# Patient Record
Sex: Female | Born: 1955 | Race: Asian | Hispanic: No | Smoking: Never smoker
Health system: Southern US, Community
[De-identification: ages and names within clinical notes are randomized; demographics above are authoritative.]

## PROBLEM LIST (undated history)

## (undated) DIAGNOSIS — I10 Essential (primary) hypertension: Secondary | ICD-10-CM

## (undated) DIAGNOSIS — E7800 Pure hypercholesterolemia, unspecified: Secondary | ICD-10-CM

---

## 2016-11-18 ENCOUNTER — Emergency Department: Payer: Medicare (Managed Care)

## 2016-11-18 ENCOUNTER — Emergency Department
Admission: EM | Admit: 2016-11-18 | Discharge: 2016-11-18 | Disposition: A | Discharge status: Home / Self Care | Payer: Medicare (Managed Care) | Attending: Emergency Medicine | Admitting: Emergency Medicine

## 2016-11-18 ENCOUNTER — Encounter: Payer: Self-pay | Admitting: Emergency Medicine

## 2016-11-18 DIAGNOSIS — R52 Pain, unspecified: Secondary | ICD-10-CM

## 2016-11-18 DIAGNOSIS — L03116 Cellulitis of left lower limb: Secondary | ICD-10-CM | POA: Diagnosis not present

## 2016-11-18 DIAGNOSIS — I10 Essential (primary) hypertension: Secondary | ICD-10-CM | POA: Insufficient documentation

## 2016-11-18 DIAGNOSIS — M7989 Other specified soft tissue disorders: Secondary | ICD-10-CM | POA: Diagnosis present

## 2016-11-18 DIAGNOSIS — L819 Disorder of pigmentation, unspecified: Secondary | ICD-10-CM

## 2016-11-18 DIAGNOSIS — R609 Edema, unspecified: Secondary | ICD-10-CM

## 2016-11-18 HISTORY — DX: Essential (primary) hypertension: I10

## 2016-11-18 HISTORY — DX: Pure hypercholesterolemia, unspecified: E78.00

## 2016-11-18 MED ORDER — SULFAMETHOXAZOLE-TRIMETHOPRIM 800-160 MG PO TABS: 1.0000 | ORAL_TABLET | Freq: Two times a day (BID) | ORAL | 0 refills | Status: AC

## 2016-11-18 MED ORDER — CEPHALEXIN 500 MG PO CAPS: 500.0000 mg | ORAL_CAPSULE | Freq: Three times a day (TID) | ORAL | 0 refills | Status: AC

## 2016-11-18 NOTE — ED Triage Notes (Signed)
Presents with family   Developed pain and swelling to left lower leg  Leg is warm touch  Denies injury

## 2016-11-18 NOTE — ED Notes (Signed)
Patient transported to Ultrasound 

## 2016-11-18 NOTE — ED Notes (Signed)
Discharge instructions reviewed with patient. Questions fielded by this RN. Patient verbalizes understanding of instructions. Patient discharged home in stable condition per Stafford MD . No acute distress noted at time of discharge.  

## 2016-11-18 NOTE — ED Provider Notes (Signed)
[facility] Emergency Department Provider Note  ____________________________________________  Time seen: Approximately 7:46 PM  I have reviewed the triage vital signs and the nursing notes.   HISTORY  Chief Complaint Leg Pain and Leg Swelling    HPI Chelsea Huff is a 60 y.o. female who complains of left leg swelling and redness over the past 2 days. Warm to touch. Denies any injury. her scratches. Has a history of hypertension and hypercholesterolemia, denies diabetes. No previous surgeries on the leg. No history of DVT. She did recently fly down to [place] from [place] about a week ago. Denies chest pain or shortness of breath. No dizziness.     Past Medical History:  Diagnosis Date  . High cholesterol   . Hypertension      There are no active problems to display for this patient.    History reviewed. No pertinent surgical history.   Prior to Admission medications   Medication Sig Start Date End Date Taking? Authorizing Provider  cephALEXin (KEFLEX) 500 MG capsule Take 1 capsule (500 mg total) by mouth 3 (three) times daily. 11/18/16   Sharman CheekPhillip Georganne Siple, MD  sulfamethoxazole-trimethoprim (BACTRIM DS) 800-160 MG tablet Take 1 tablet by mouth 2 (two) times daily. 11/18/16   Sharman CheekPhillip Aldona Bryner, MD     Allergies Patient has no known allergies.   No family history on file.  Social History Social History  Substance Use Topics  . Smoking status: Never Smoker  . Smokeless tobacco: Never Used  . Alcohol use No    Review of Systems  Constitutional:   No fever or chills.  ENT:   No sore throat. No rhinorrhea.Occasional bitter taste in the throat. Cardiovascular:   No chest pain. Respiratory:   No dyspnea or cough. Gastrointestinal:   Negative for abdominal pain, vomiting and diarrhea.  Genitourinary:   Negative for dysuria or difficulty urinating. Musculoskeletal:   Left leg pain and swelling and redness Neurological:    Negative for headaches 10-point ROS otherwise negative.  ____________________________________________   PHYSICAL EXAM:  VITAL SIGNS: ED Triage Vitals  Enc Vitals Group     BP 11/18/16 1745 (!) 146/82     Pulse Rate 11/18/16 1745 67     Resp 11/18/16 1745 20     Temp 11/18/16 1745 98.2 F (36.8 C)     Temp Source 11/18/16 1745 Oral     SpO2 11/18/16 1745 97 %     Weight 11/18/16 1745 167 lb 8.8 oz (76 kg)     Height 11/18/16 1745 5\' 4"  (1.626 m)     Head Circumference --      Peak Flow --      Pain Score 11/18/16 1746 8     Pain Loc --      Pain Edu? --      Excl. in GC? --     Vital signs reviewed, nursing assessments reviewed.   Constitutional:   Alert and oriented. Well appearing and in no distress. Eyes:   No scleral icterus. No conjunctival pallor. PERRL. EOMI.  No nystagmus. ENT   Head:   Normocephalic and atraumatic.   Nose:   No congestion/rhinnorhea. No septal hematoma   Mouth/Throat:   MMM, no pharyngeal erythema. No peritonsillar mass.    Neck:   No stridor. No SubQ emphysema. No meningismus.Thyroid nonpalpable Hematological/Lymphatic/Immunilogical:   No cervical lymphadenopathy. Cardiovascular:   RRR. Symmetric bilateral radial and DP pulses.  No murmurs.  Respiratory:   Normal respiratory effort  without tachypnea nor retractions. Breath sounds are clear and equal bilaterally. No wheezes/rales/rhonchi. Gastrointestinal:   Soft and nontender. Non distended. There is no CVA tenderness.  No rebound, rigidity, or guarding. Genitourinary:   deferred Musculoskeletal:   Nontender with normal range of motion in all extremities. No joint effusions.  There is reddish purple erythema on the left lower leg from above the ankle to the mid shin. It is tender and warm to the touch in this area. There is some lymphangitis streaking. No drainage or wounds. No crepitus. Neurologic:   Normal speech and language.  CN 2-10 normal. Motor grossly intact. No gross  focal neurologic deficits are appreciated.  Skin:    Skin is warm, dry and intact. Erythematous inflammatory lesion as noted above on the left lower leg.  No petechiae, purpura, or bullae.  ____________________________________________    LABS (pertinent positives/negatives) (all labs ordered are listed, but only abnormal results are displayed) Labs Reviewed - No data to display ____________________________________________   EKG    ____________________________________________    RADIOLOGY  Ultrasound left lower extremity negative for DVT  ____________________________________________   PROCEDURES Procedures  ____________________________________________   INITIAL IMPRESSION / ASSESSMENT AND PLAN / ED COURSE  Pertinent labs & imaging results that were available during my care of the patient were reviewed by me and considered in my medical decision making (see chart for details).  Patient well appearing no acute distress, unremarkable vital signs. Presents with inflammation of the left lower leg, consistent with cellulitis. No high risk features. Negative for DVT. She with Keflex and Bactrim, follow up with primary care. Return precautions given.     Clinical Course    ____________________________________________   FINAL CLINICAL IMPRESSION(S) / ED DIAGNOSES  Final diagnoses:  Cellulitis of left lower extremity      New Prescriptions   CEPHALEXIN (KEFLEX) 500 MG CAPSULE    Take 1 capsule (500 mg total) by mouth 3 (three) times daily.   SULFAMETHOXAZOLE-TRIMETHOPRIM (BACTRIM DS) 800-160 MG TABLET    Take 1 tablet by mouth 2 (two) times daily.     Portions of this note were generated with dragon dictation software. Dictation errors may occur despite best attempts at proofreading.    Sharman CheekPhillip Minerva Bluett, MD 11/18/16 1950

## 2016-11-25 ENCOUNTER — Emergency Department: Payer: Medicaid - Out of State

## 2016-11-25 ENCOUNTER — Encounter: Payer: Self-pay | Admitting: Emergency Medicine

## 2016-11-25 ENCOUNTER — Emergency Department
Admission: EM | Admit: 2016-11-25 | Discharge: 2016-11-25 | Disposition: A | Discharge status: Home / Self Care | Payer: Medicaid - Out of State | Attending: Emergency Medicine | Admitting: Emergency Medicine

## 2016-11-25 DIAGNOSIS — I10 Essential (primary) hypertension: Secondary | ICD-10-CM | POA: Insufficient documentation

## 2016-11-25 DIAGNOSIS — Z79899 Other long term (current) drug therapy: Secondary | ICD-10-CM | POA: Insufficient documentation

## 2016-11-25 DIAGNOSIS — L03116 Cellulitis of left lower limb: Secondary | ICD-10-CM

## 2016-11-25 MED ORDER — MUPIROCIN 2 % EX OINT: TOPICAL_OINTMENT | CUTANEOUS | 0 refills | Status: AC

## 2016-11-25 MED ORDER — SULFAMETHOXAZOLE-TRIMETHOPRIM 800-160 MG PO TABS: 1.0000 | ORAL_TABLET | Freq: Two times a day (BID) | ORAL | 0 refills | Status: AC

## 2016-11-25 NOTE — ED Notes (Signed)
Pt to ed with c/o continued left leg pain, redness and swelling. Pt was seen here Dec 19th for same, started on antibiotics. Pt reports increased pain when standing. Left lower leg continues to have mild swelling, mild redness and pain.  Pt with good ROM in left foot and +pulses.

## 2016-11-25 NOTE — ED Notes (Signed)
Pt alert and oriented X4, active, cooperative, pt in NAD. RR even and unlabored, color WNL.  Pt informed to return if any life threatening symptoms occur.  Pt offered wheelchair upon discharge, refused.

## 2016-11-25 NOTE — ED Provider Notes (Signed)
[facility] Emergency Department Provider Note        Time seen: ----------------------------------------- 2:40 PM on 11/25/2016 -----------------------------------------    I have reviewed the triage vital signs and the nursing notes.   HISTORY  Chief Complaint Leg Pain    HPI Chelsea Huff is a 60 y.o. female who presents to ER for persistent left leg and ankle redness and swelling. Patient was seen here placed on antibiotics and today is day 7 of antibiotics. Patient reports pain is worse when standing. She had an ultrasound done a week ago which was negative for DVT. She denies fevers, chills or other complaints. The pain and redness has improved since prior.   Past Medical History:  Diagnosis Date  . High cholesterol   . Hypertension     There are no active problems to display for this patient.   No past surgical history on file.  Allergies Penicillins  Social History Social History  Substance Use Topics  . Smoking status: Never Smoker  . Smokeless tobacco: Never Used  . Alcohol use No    Review of Systems Constitutional: Negative for fever. Cardiovascular: Negative for chest pain. Respiratory: Negative for shortness of breath. Gastrointestinal: Negative for abdominal pain, vomiting and diarrhea. Genitourinary: Negative for dysuria. Musculoskeletal:Positive for left leg pain Skin: Positive for left leg redness Neurological: Negative for headaches, focal weakness or numbness.  10-point ROS otherwise negative.  ____________________________________________   PHYSICAL EXAM:  VITAL SIGNS: ED Triage Vitals [11/25/16 1127]  Enc Vitals Group     BP 133/60     Pulse Rate 63     Resp 18     Temp 97.6 F (36.4 C)     Temp Source Oral     SpO2 98 %     Weight 170 lb (77.1 kg)     Height 5\' 4"  (1.626 m)     Head Circumference      Peak Flow      Pain Score 6     Pain Loc      Pain Edu?      Excl. in GC?    Constitutional:  Alert and oriented. Well appearing and in no distress. Eyes: Conjunctivae are normal. PERRL. Normal extraocular movements. Musculoskeletal: Mild tenderness around the left ankle, mild skin erythema is noted in this region. There is no lymphangitis or expanding redness more proximally. Left lower extremity is neurovascularly intact Neurologic:  Normal speech and language. No gross focal neurologic deficits are appreciated.  Skin:  Mild left leg erythema is noted Psychiatric: Mood and affect are normal. Speech and behavior are normal.  ____________________________________________  ED COURSE:  Pertinent labs & imaging results that were available during my care of the patient were reviewed by me and considered in my medical decision making (see chart for details). Clinical Course   Patient presents to the ER for recheck of her left leg pain. We will recheck ultrasound and likely obtain basic imaging.  Procedures ____________________________________________   RADIOLOGY  Left lower extremity ultrasound is negative for DVT Left ankle x-rays Are unremarkable ____________________________________________  FINAL ASSESSMENT AND PLAN  Cellulitis  Plan: Patient with labs and imaging as dictated above. Patient's findings are consistent with improving cellulitis. She may need a longer course of Septra. I will also prescribe Bactroban ointment to be applied topically. She is stable for discharge.   Emily FilbertWilliams, Kavita Bartl E, MD   Note: This dictation was prepared with Dragon dictation. Any transcriptional errors that result from this process are  unintentional    Emily FilbertJonathan E Acie Custis, MD 11/25/16 [phone]

## 2016-11-25 NOTE — ED Triage Notes (Signed)
Pt here with continued left leg pain, redness and swelling. Pt was seen here, placed on antibiotics and today is day 7. Pt reports increased pain when standing.

## 2016-11-25 NOTE — ED Notes (Signed)
Xray done at this time

## 2016-12-20 IMAGING — US US EXTREM LOW VENOUS*L*
1 series · 13 of 24 positions shown · non-contrast
Comparison: None.

CLINICAL DATA: 60-year-old female with swelling pain and
discoloration of the left lower extremity for 2 days. Initial
encounter.



[Series 1: us extrem low venous*left* · 0.08mm/px · 41 acquisitions, 13 frames shown]
[im 1/41]
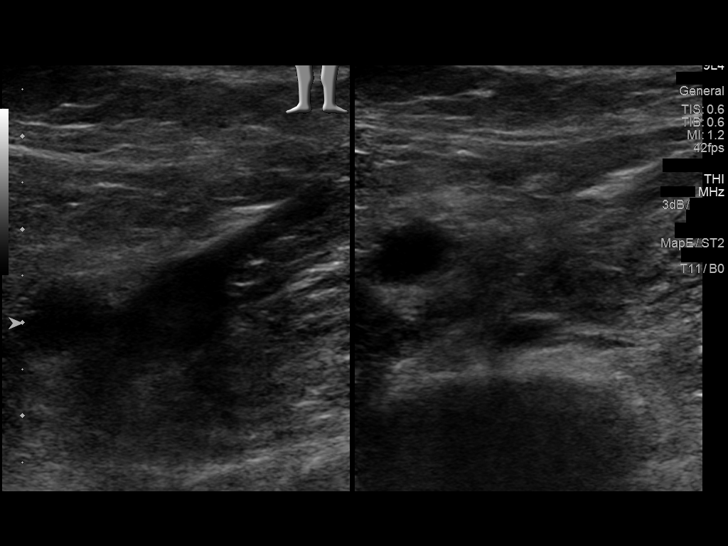
[im 4/41]
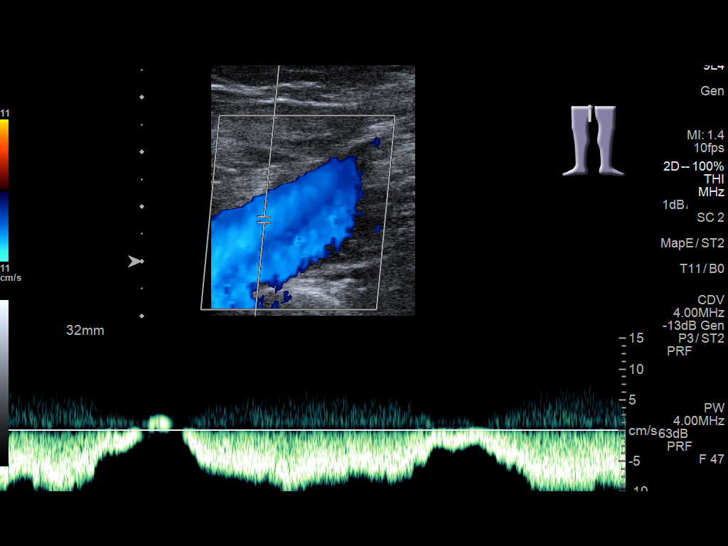
[im 7/41]
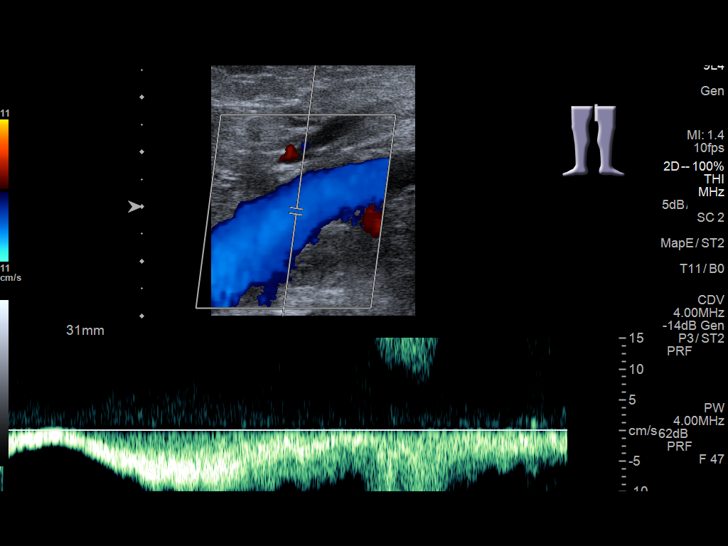
[im 11/41]
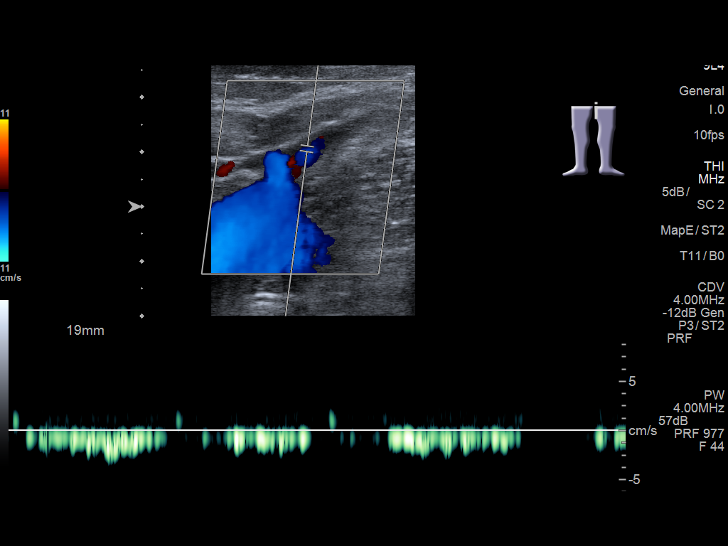
[im 14/41]
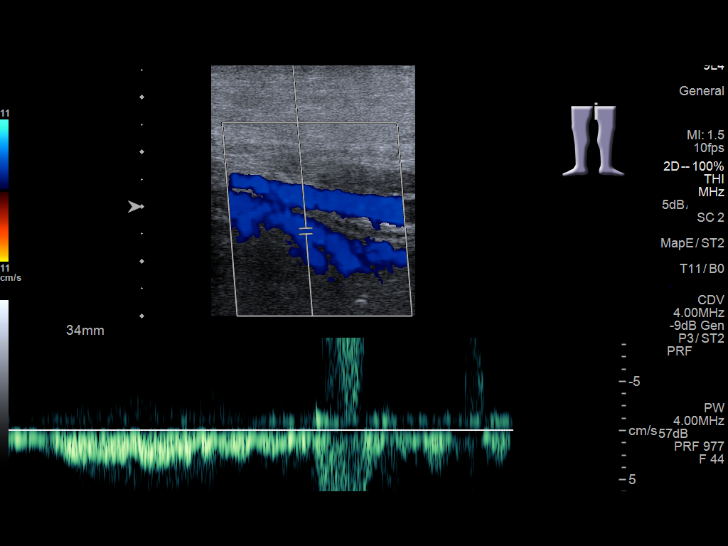
[im 18/41]
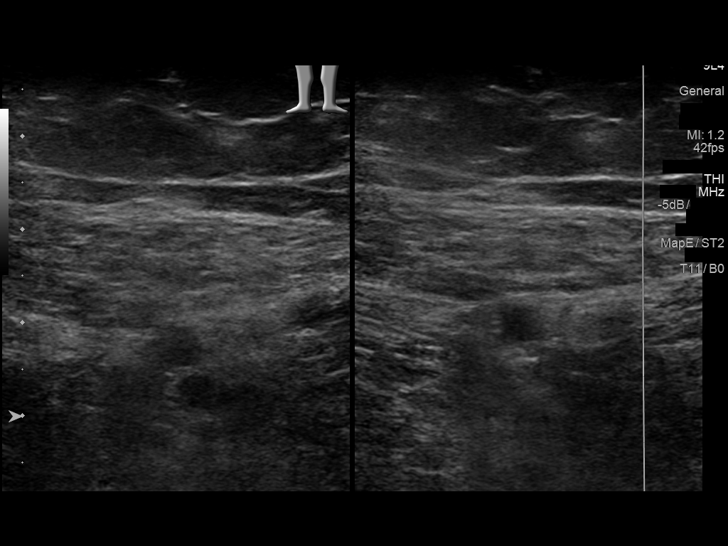
[im 23/41]
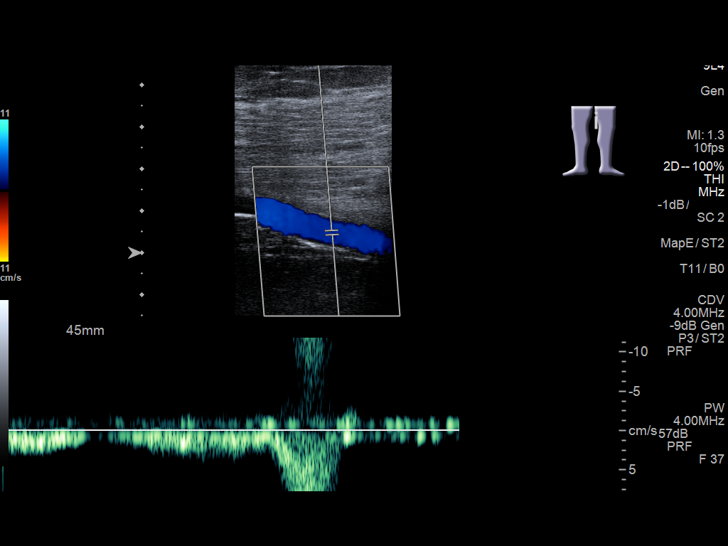
[im 25/41]
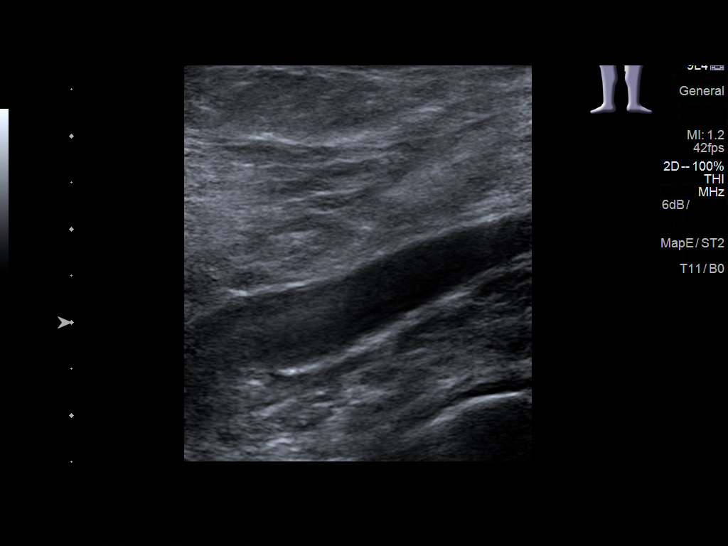
[im 27/41]
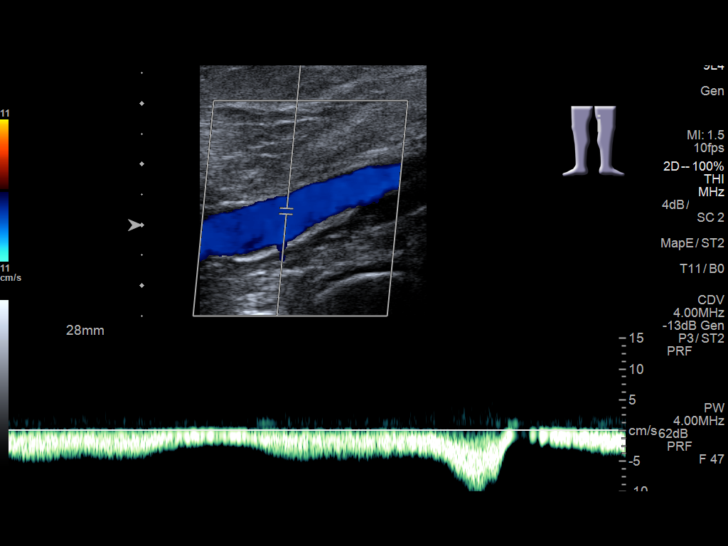
[im 30/41]
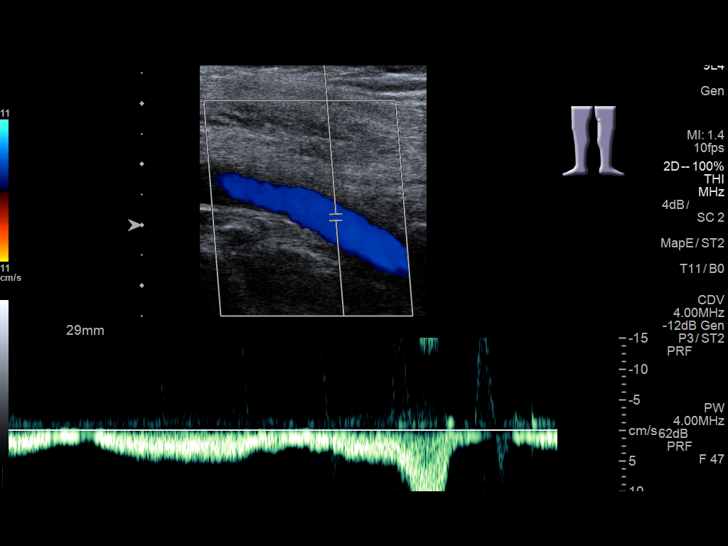
[im 34/41]
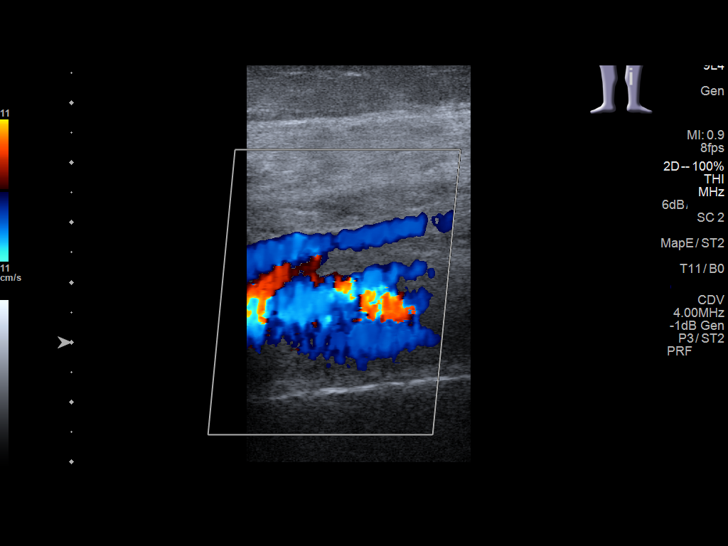
[im 37/41]
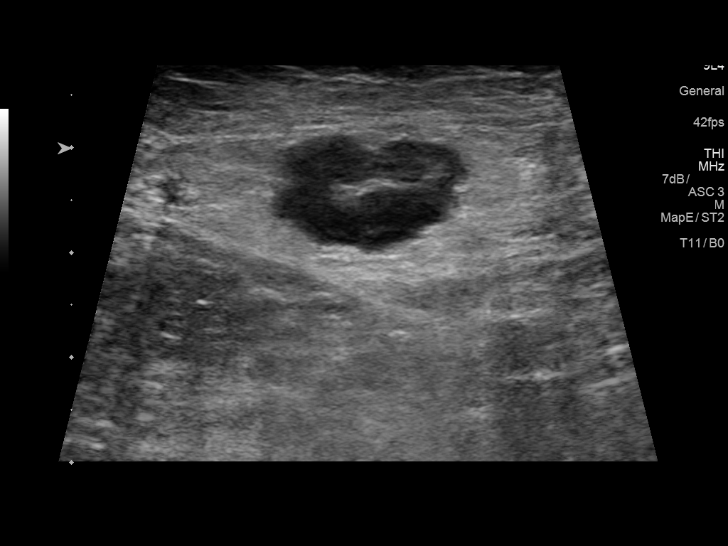
[im 41/41]
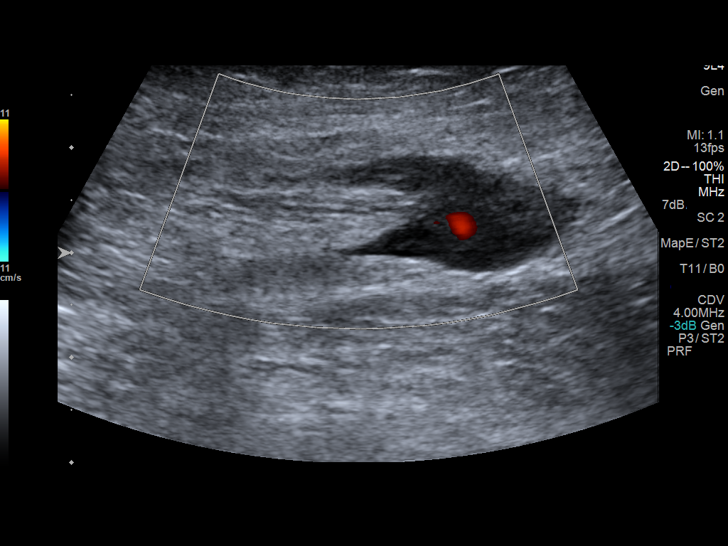

[13 of 24 positions shown; findings below may reference images not displayed]

FINDINGS: Contralateral Common Femoral Vein: Respiratory phasicity is normal
and symmetric with the symptomatic side. No evidence of thrombus.
Normal compressibility.

Common Femoral Vein: No evidence of thrombus. Normal
compressibility, respiratory phasicity and response to augmentation.

Saphenofemoral Junction: No evidence of thrombus. Normal
compressibility and flow on color Doppler imaging.

Profunda Femoral Vein: No evidence of thrombus. Normal
compressibility and flow on color Doppler imaging.

Femoral Vein: No evidence of thrombus. Normal compressibility,
respiratory phasicity and response to augmentation.

Popliteal Vein: Rouleaux formation noted (series 2), but no evidence
of thrombus. Normal compressibility, respiratory phasicity and
response to augmentation.

Calf Veins: No evidence of thrombus. Normal compressibility and flow
on color Doppler imaging.

Superficial Great Saphenous Vein: No evidence of thrombus. Normal
compressibility and flow on color Doppler imaging.

Venous Reflux:  None.

Other Findings: Normal left inguinal lymph nodes incidentally noted.
IMPRESSION: Rouleaux formation in the left popliteal vein which can be seen with
slow venous flow, but there is no evidence of left lower extremity
deep venous thrombosis.
